# Patient Record
Sex: Female | Born: 2000 | Race: White | Hispanic: No | Marital: Single | Smoking: Never smoker
Health system: Southern US, Community
[De-identification: ages and names within clinical notes are randomized; demographics above are authoritative.]

## PROBLEM LIST (undated history)

## (undated) DIAGNOSIS — G510 Bell's palsy: Secondary | ICD-10-CM

## (undated) HISTORY — PX: MOUTH SURGERY: SHX715

---
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
## 2021-10-25 DIAGNOSIS — R109 Unspecified abdominal pain: Secondary | ICD-10-CM | POA: Diagnosis not present

## 2021-10-25 DIAGNOSIS — K529 Noninfective gastroenteritis and colitis, unspecified: Secondary | ICD-10-CM | POA: Diagnosis not present

## 2022-03-13 DIAGNOSIS — E559 Vitamin D deficiency, unspecified: Secondary | ICD-10-CM | POA: Diagnosis not present

## 2022-03-13 DIAGNOSIS — Z131 Encounter for screening for diabetes mellitus: Secondary | ICD-10-CM | POA: Diagnosis not present

## 2022-03-13 DIAGNOSIS — E7849 Other hyperlipidemia: Secondary | ICD-10-CM | POA: Diagnosis not present

## 2022-03-13 DIAGNOSIS — Z0000 Encounter for general adult medical examination without abnormal findings: Secondary | ICD-10-CM | POA: Diagnosis not present

## 2022-03-13 DIAGNOSIS — Z1329 Encounter for screening for other suspected endocrine disorder: Secondary | ICD-10-CM | POA: Diagnosis not present

## 2022-03-13 DIAGNOSIS — Z6834 Body mass index (BMI) 34.0-34.9, adult: Secondary | ICD-10-CM | POA: Diagnosis not present

## 2023-08-04 DIAGNOSIS — Z0000 Encounter for general adult medical examination without abnormal findings: Secondary | ICD-10-CM | POA: Diagnosis not present

## 2023-08-04 DIAGNOSIS — Z6836 Body mass index (BMI) 36.0-36.9, adult: Secondary | ICD-10-CM | POA: Diagnosis not present

## 2023-08-04 DIAGNOSIS — Z131 Encounter for screening for diabetes mellitus: Secondary | ICD-10-CM | POA: Diagnosis not present

## 2023-08-04 DIAGNOSIS — F411 Generalized anxiety disorder: Secondary | ICD-10-CM | POA: Diagnosis not present
